# Patient Record
Sex: Male | Born: 1995 | Race: Black or African American | Hispanic: No | Marital: Single | Smoking: Never smoker
Health system: Southern US, Community
[De-identification: ages and names within clinical notes are randomized; demographics above are authoritative.]

## PROBLEM LIST (undated history)

## (undated) HISTORY — PX: SHOULDER ARTHROSCOPY: SHX128

---
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
## 2014-02-04 ENCOUNTER — Other Ambulatory Visit: Payer: Self-pay | Admitting: Family Medicine

## 2014-02-04 DIAGNOSIS — M25512 Pain in left shoulder: Secondary | ICD-10-CM

## 2014-02-18 ENCOUNTER — Ambulatory Visit
Admission: RE | Admit: 2014-02-18 | Discharge: 2014-02-18 | Disposition: A | Discharge status: Home / Self Care | Payer: Medicaid Other | Source: Ambulatory Visit | Attending: Family Medicine | Admitting: Family Medicine

## 2014-02-18 ENCOUNTER — Ambulatory Visit
Admission: RE | Admit: 2014-02-18 | Discharge: 2014-02-18 | Disposition: A | Discharge status: Home / Self Care | Payer: BC Managed Care – PPO | Source: Ambulatory Visit | Attending: Family Medicine | Admitting: Family Medicine

## 2014-02-18 DIAGNOSIS — M25512 Pain in left shoulder: Secondary | ICD-10-CM

## 2014-02-18 MED ORDER — IOHEXOL 180 MG/ML  SOLN
15.0000 mL | Freq: Once | INTRAMUSCULAR | Status: AC | PRN
  Administered 2014-02-18: 15 mL via INTRA_ARTICULAR

## 2017-06-12 ENCOUNTER — Ambulatory Visit (HOSPITAL_COMMUNITY)
Admission: EM | Admit: 2017-06-12 | Discharge: 2017-06-12 | Disposition: A | Discharge status: Home / Self Care | Payer: Managed Care, Other (non HMO) | Attending: Urgent Care | Admitting: Urgent Care

## 2017-06-12 ENCOUNTER — Encounter (HOSPITAL_COMMUNITY): Payer: Self-pay | Admitting: Emergency Medicine

## 2017-06-12 ENCOUNTER — Other Ambulatory Visit: Payer: Self-pay

## 2017-06-12 DIAGNOSIS — Z202 Contact with and (suspected) exposure to infections with a predominantly sexual mode of transmission: Secondary | ICD-10-CM | POA: Diagnosis not present

## 2017-06-12 DIAGNOSIS — Z7251 High risk heterosexual behavior: Secondary | ICD-10-CM | POA: Diagnosis not present

## 2017-06-12 DIAGNOSIS — R369 Urethral discharge, unspecified: Secondary | ICD-10-CM

## 2017-06-12 DIAGNOSIS — R300 Dysuria: Secondary | ICD-10-CM

## 2017-06-12 MED ORDER — AZITHROMYCIN 250 MG PO TABS
ORAL_TABLET | ORAL | Status: AC
  Filled 2017-06-12: qty 4

## 2017-06-12 MED ORDER — AZITHROMYCIN 250 MG PO TABS
1000.0000 mg | ORAL_TABLET | Freq: Once | ORAL | Status: AC
  Administered 2017-06-12: 1000 mg via ORAL

## 2017-06-12 MED ORDER — LIDOCAINE HCL (PF) 2 % IJ SOLN
INTRAMUSCULAR | Status: AC
Start: 2017-06-12 — End: 2017-06-12
  Filled 2017-06-12: qty 2

## 2017-06-12 MED ORDER — LIDOCAINE HCL (PF) 1 % IJ SOLN
INTRAMUSCULAR | Status: AC
  Filled 2017-06-12: qty 2

## 2017-06-12 MED ORDER — CEFTRIAXONE SODIUM 250 MG IJ SOLR
INTRAMUSCULAR | Status: AC
  Filled 2017-06-12: qty 250

## 2017-06-12 MED ORDER — CEFTRIAXONE SODIUM 250 MG IJ SOLR
250.0000 mg | Freq: Once | INTRAMUSCULAR | Status: AC
  Administered 2017-06-12: 250 mg via INTRAMUSCULAR

## 2017-06-12 NOTE — ED Triage Notes (Signed)
Seen by mani, pa prior to nursing staff 

## 2017-06-12 NOTE — ED Provider Notes (Signed)
  MRN: [id number] DOB: 1995-09-16  Subjective:   Johnathan Herrera is a 22 y.o. male presenting for 1 week history of penile discharge, mild dysuria. Patient has unprotected sex. Went and got tested at [facility] and results were negative. Denies fever, hematuria, urinary frequency, penile discharge, penile swelling, testicular pain, testicular swelling, anal pain, groin pain. Denies smoking cigarettes or drinking alcohol. He is not taking any medications, has No Known Allergies. Also denies past medical and surgical history.   Objective:   Vitals: BP 129/70 (BP Location: Right Arm)   Pulse 72   Temp 98.2 F (36.8 C) (Oral)   Resp 16   SpO2 100%   Physical Exam  Constitutional: He is oriented to person, place, and time. He appears well-developed and well-nourished.  Cardiovascular: Normal rate.  Pulmonary/Chest: Effort normal.  Neurological: He is alert and oriented to person, place, and time.   Assessment and Plan :   Unprotected sex  Penile discharge  Empiric treatment for STI as recommended by CDC. Labs pending. Return-to-clinic precautions discussed, patient verbalized understanding.    Johnathan Herrera, Johnathan Goree, PA-C 06/12/17 2121

## 2017-06-13 LAB — URINE CYTOLOGY ANCILLARY ONLY
Chlamydia: NEGATIVE
Neisseria Gonorrhea: NEGATIVE
TRICH (WINDOWPATH): NEGATIVE

## 2017-06-15 ENCOUNTER — Telehealth (HOSPITAL_COMMUNITY): Payer: Self-pay

## 2017-06-15 NOTE — Telephone Encounter (Signed)
Pt contacted and aware of negative results from recent visit

## 2019-08-13 DEATH — deceased

## 2020-11-07 ENCOUNTER — Encounter (HOSPITAL_COMMUNITY)
Admission: EM | Disposition: A | Discharge status: Home / Self Care | Payer: Self-pay | Source: Home / Self Care | Attending: Emergency Medicine

## 2020-11-07 ENCOUNTER — Emergency Department (HOSPITAL_COMMUNITY): Payer: Managed Care, Other (non HMO) | Admitting: Anesthesiology

## 2020-11-07 ENCOUNTER — Emergency Department (HOSPITAL_COMMUNITY): Payer: Managed Care, Other (non HMO)

## 2020-11-07 ENCOUNTER — Encounter (HOSPITAL_BASED_OUTPATIENT_CLINIC_OR_DEPARTMENT_OTHER): Payer: Self-pay

## 2020-11-07 ENCOUNTER — Observation Stay (HOSPITAL_BASED_OUTPATIENT_CLINIC_OR_DEPARTMENT_OTHER)
Admission: EM | Admit: 2020-11-07 | Discharge: 2020-11-08 | Disposition: A | Discharge status: Home / Self Care | Payer: Managed Care, Other (non HMO) | Attending: Orthopedic Surgery | Admitting: Orthopedic Surgery

## 2020-11-07 ENCOUNTER — Emergency Department (HOSPITAL_BASED_OUTPATIENT_CLINIC_OR_DEPARTMENT_OTHER): Payer: Managed Care, Other (non HMO)

## 2020-11-07 DIAGNOSIS — Z23 Encounter for immunization: Secondary | ICD-10-CM | POA: Diagnosis not present

## 2020-11-07 DIAGNOSIS — S93115A Dislocation of interphalangeal joint of left lesser toe(s), initial encounter: Secondary | ICD-10-CM | POA: Insufficient documentation

## 2020-11-07 DIAGNOSIS — S99922A Unspecified injury of left foot, initial encounter: Secondary | ICD-10-CM | POA: Diagnosis present

## 2020-11-07 DIAGNOSIS — S96122A Laceration of muscle and tendon of long extensor muscle of toe at ankle and foot level, left foot, initial encounter: Secondary | ICD-10-CM | POA: Diagnosis not present

## 2020-11-07 DIAGNOSIS — S93119A Dislocation of interphalangeal joint of unspecified toe(s), initial encounter: Secondary | ICD-10-CM

## 2020-11-07 DIAGNOSIS — Z20822 Contact with and (suspected) exposure to covid-19: Secondary | ICD-10-CM | POA: Diagnosis not present

## 2020-11-07 DIAGNOSIS — S92415B Nondisplaced fracture of proximal phalanx of left great toe, initial encounter for open fracture: Secondary | ICD-10-CM | POA: Diagnosis not present

## 2020-11-07 DIAGNOSIS — S92912B Unspecified fracture of left toe(s), initial encounter for open fracture: Secondary | ICD-10-CM

## 2020-11-07 DIAGNOSIS — S91109A Unspecified open wound of unspecified toe(s) without damage to nail, initial encounter: Secondary | ICD-10-CM

## 2020-11-07 HISTORY — PX: I & D EXTREMITY: SHX5045

## 2020-11-07 HISTORY — PX: PERCUTANEOUS PINNING: SHX2209

## 2020-11-07 LAB — CBC WITH DIFFERENTIAL/PLATELET
Abs Immature Granulocytes: 0.02 10*3/uL (ref 0.00–0.07)
Basophils Absolute: 0 10*3/uL (ref 0.0–0.1)
Basophils Relative: 0 %
Eosinophils Absolute: 0.1 10*3/uL (ref 0.0–0.5)
Eosinophils Relative: 2 %
HCT: 47.7 % (ref 39.0–52.0)
Hemoglobin: 16.1 g/dL (ref 13.0–17.0)
Immature Granulocytes: 0 %
Lymphocytes Relative: 56 %
Lymphs Abs: 4.1 10*3/uL — ABNORMAL HIGH (ref 0.7–4.0)
MCH: 27.1 pg (ref 26.0–34.0)
MCHC: 33.8 g/dL (ref 30.0–36.0)
MCV: 80.3 fL (ref 80.0–100.0)
Monocytes Absolute: 0.6 10*3/uL (ref 0.1–1.0)
Monocytes Relative: 8 %
Neutro Abs: 2.5 10*3/uL (ref 1.7–7.7)
Neutrophils Relative %: 34 %
Platelets: 333 10*3/uL (ref 150–400)
RBC: 5.94 MIL/uL — ABNORMAL HIGH (ref 4.22–5.81)
RDW: 12.9 % (ref 11.5–15.5)
WBC: 7.4 10*3/uL (ref 4.0–10.5)
nRBC: 0 % (ref 0.0–0.2)

## 2020-11-07 LAB — COMPREHENSIVE METABOLIC PANEL
ALT: 23 U/L (ref 0–44)
AST: 25 U/L (ref 15–41)
Albumin: 4.4 g/dL (ref 3.5–5.0)
Alkaline Phosphatase: 58 U/L (ref 38–126)
Anion gap: 9 (ref 5–15)
BUN: 15 mg/dL (ref 6–20)
CO2: 27 mmol/L (ref 22–32)
Calcium: 9.3 mg/dL (ref 8.9–10.3)
Chloride: 102 mmol/L (ref 98–111)
Creatinine, Ser: 1.21 mg/dL (ref 0.61–1.24)
GFR, Estimated: >60 mL/min (ref >60)
Glucose, Bld: 148 mg/dL — ABNORMAL HIGH (ref 70–99)
Potassium: 3.2 mmol/L — ABNORMAL LOW (ref 3.5–5.1)
Sodium: 138 mmol/L (ref 135–145)
Total Bilirubin: 0.6 mg/dL (ref 0.3–1.2)
Total Protein: 8.2 g/dL — ABNORMAL HIGH (ref 6.5–8.1)

## 2020-11-07 LAB — RESP PANEL BY RT-PCR (FLU A&B, COVID) ARPGX2
Influenza A by PCR: NEGATIVE
Influenza B by PCR: NEGATIVE
SARS Coronavirus 2 by RT PCR: NEGATIVE

## 2020-11-07 SURGERY — IRRIGATION AND DEBRIDEMENT EXTREMITY
Anesthesia: General | Laterality: Left

## 2020-11-07 MED ORDER — PHENYLEPHRINE 40 MCG/ML (10ML) SYRINGE FOR IV PUSH (FOR BLOOD PRESSURE SUPPORT)
PREFILLED_SYRINGE | INTRAVENOUS | Status: AC
  Filled 2020-11-07: qty 10

## 2020-11-07 MED ORDER — METHOCARBAMOL 1000 MG/10ML IJ SOLN
500.0000 mg | Freq: Four times a day (QID) | INTRAVENOUS | Status: DC | PRN
  Filled 2020-11-07: qty 5

## 2020-11-07 MED ORDER — ORAL CARE MOUTH RINSE: 15.0000 mL | Freq: Once | OROMUCOSAL | Status: DC

## 2020-11-07 MED ORDER — LIDOCAINE 2% (20 MG/ML) 5 ML SYRINGE
INTRAMUSCULAR | Status: DC | PRN
  Administered 2020-11-07: 80 mg via INTRAVENOUS

## 2020-11-07 MED ORDER — CEFAZOLIN SODIUM 1 G IJ SOLR
INTRAMUSCULAR | Status: AC
  Filled 2020-11-07: qty 20

## 2020-11-07 MED ORDER — SODIUM CHLORIDE 0.9% FLUSH
3.0000 mL | Freq: Two times a day (BID) | INTRAVENOUS | Status: DC
  Administered 2020-11-08 (×2): 3 mL via INTRAVENOUS

## 2020-11-07 MED ORDER — SUGAMMADEX SODIUM 200 MG/2ML IV SOLN
INTRAVENOUS | Status: DC | PRN
  Administered 2020-11-07: 200 mg via INTRAVENOUS

## 2020-11-07 MED ORDER — LACTATED RINGERS IV SOLN: INTRAVENOUS | Status: DC

## 2020-11-07 MED ORDER — CHLORHEXIDINE GLUCONATE 0.12 % MT SOLN: 15.0000 mL | Freq: Once | OROMUCOSAL | Status: DC

## 2020-11-07 MED ORDER — MIDAZOLAM HCL 2 MG/2ML IJ SOLN
INTRAMUSCULAR | Status: AC
  Filled 2020-11-07: qty 2

## 2020-11-07 MED ORDER — FENTANYL CITRATE (PF) 100 MCG/2ML IJ SOLN: 25.0000 ug | INTRAMUSCULAR | Status: DC | PRN

## 2020-11-07 MED ORDER — HYDROMORPHONE HCL 1 MG/ML IJ SOLN
2.0000 mg | Freq: Once | INTRAMUSCULAR | Status: AC
  Administered 2020-11-07: 2 mg via INTRAVENOUS
  Filled 2020-11-07: qty 2

## 2020-11-07 MED ORDER — BISACODYL 5 MG PO TBEC: 5.0000 mg | DELAYED_RELEASE_TABLET | Freq: Every day | ORAL | Status: DC | PRN

## 2020-11-07 MED ORDER — SODIUM CHLORIDE 0.9% FLUSH
3.0000 mL | INTRAVENOUS | Status: DC | PRN
  Administered 2020-11-08: 3 mL via INTRAVENOUS

## 2020-11-07 MED ORDER — CEFAZOLIN SODIUM-DEXTROSE 2-3 GM-%(50ML) IV SOLR
INTRAVENOUS | Status: DC | PRN
  Administered 2020-11-07: 2 g via INTRAVENOUS

## 2020-11-07 MED ORDER — MIDAZOLAM HCL 2 MG/2ML IJ SOLN
INTRAMUSCULAR | Status: DC | PRN
  Administered 2020-11-07: 2 mg via INTRAVENOUS

## 2020-11-07 MED ORDER — 0.9 % SODIUM CHLORIDE (POUR BTL) OPTIME
TOPICAL | Status: DC | PRN
  Administered 2020-11-07: 1000 mL

## 2020-11-07 MED ORDER — LIDOCAINE 2% (20 MG/ML) 5 ML SYRINGE
INTRAMUSCULAR | Status: AC
  Filled 2020-11-07: qty 10

## 2020-11-07 MED ORDER — ROCURONIUM BROMIDE 10 MG/ML (PF) SYRINGE
PREFILLED_SYRINGE | INTRAVENOUS | Status: DC | PRN
  Administered 2020-11-07: 50 mg via INTRAVENOUS

## 2020-11-07 MED ORDER — ONDANSETRON HCL 4 MG/2ML IJ SOLN
INTRAMUSCULAR | Status: AC
  Filled 2020-11-07: qty 4

## 2020-11-07 MED ORDER — ROCURONIUM BROMIDE 10 MG/ML (PF) SYRINGE
PREFILLED_SYRINGE | INTRAVENOUS | Status: AC
  Filled 2020-11-07: qty 20

## 2020-11-07 MED ORDER — ACETAMINOPHEN 10 MG/ML IV SOLN
INTRAVENOUS | Status: AC
  Filled 2020-11-07: qty 100

## 2020-11-07 MED ORDER — ONDANSETRON HCL 4 MG/2ML IJ SOLN
4.0000 mg | Freq: Once | INTRAMUSCULAR | Status: AC
  Administered 2020-11-07: 4 mg via INTRAVENOUS
  Filled 2020-11-07: qty 2

## 2020-11-07 MED ORDER — PROPOFOL 10 MG/ML IV BOLUS
INTRAVENOUS | Status: AC
  Filled 2020-11-07: qty 20

## 2020-11-07 MED ORDER — DOCUSATE SODIUM 100 MG PO CAPS
100.0000 mg | ORAL_CAPSULE | Freq: Two times a day (BID) | ORAL | Status: DC
  Administered 2020-11-08 (×2): 100 mg via ORAL
  Filled 2020-11-07 (×2): qty 1

## 2020-11-07 MED ORDER — CEFAZOLIN SODIUM-DEXTROSE 2-4 GM/100ML-% IV SOLN
2.0000 g | Freq: Three times a day (TID) | INTRAVENOUS | Status: AC
  Administered 2020-11-08 (×3): 2 g via INTRAVENOUS
  Filled 2020-11-07 (×3): qty 100

## 2020-11-07 MED ORDER — LACTATED RINGERS IV SOLN: INTRAVENOUS | Status: DC | PRN

## 2020-11-07 MED ORDER — ONDANSETRON HCL 4 MG/2ML IJ SOLN: 4.0000 mg | Freq: Four times a day (QID) | INTRAMUSCULAR | Status: DC | PRN

## 2020-11-07 MED ORDER — FENTANYL CITRATE (PF) 250 MCG/5ML IJ SOLN
INTRAMUSCULAR | Status: DC | PRN
  Administered 2020-11-07: 100 ug via INTRAVENOUS
  Administered 2020-11-07 (×3): 50 ug via INTRAVENOUS

## 2020-11-07 MED ORDER — ACETAMINOPHEN 10 MG/ML IV SOLN
INTRAVENOUS | Status: DC | PRN
  Administered 2020-11-07: 1000 mg via INTRAVENOUS

## 2020-11-07 MED ORDER — POTASSIUM CHLORIDE IN NACL 20-0.9 MEQ/L-% IV SOLN
INTRAVENOUS | Status: DC
  Filled 2020-11-07: qty 1000

## 2020-11-07 MED ORDER — OXYCODONE-ACETAMINOPHEN 5-325 MG PO TABS
1.0000 | ORAL_TABLET | ORAL | Status: DC | PRN
  Administered 2020-11-08: 1 via ORAL
  Filled 2020-11-07: qty 1

## 2020-11-07 MED ORDER — AMISULPRIDE (ANTIEMETIC) 5 MG/2ML IV SOLN: 10.0000 mg | Freq: Once | INTRAVENOUS | Status: DC | PRN

## 2020-11-07 MED ORDER — ONDANSETRON HCL 4 MG/2ML IJ SOLN
INTRAMUSCULAR | Status: DC | PRN
  Administered 2020-11-07: 4 mg via INTRAVENOUS

## 2020-11-07 MED ORDER — TETANUS-DIPHTH-ACELL PERTUSSIS 5-2.5-18.5 LF-MCG/0.5 IM SUSY
0.5000 mL | PREFILLED_SYRINGE | Freq: Once | INTRAMUSCULAR | Status: AC
  Administered 2020-11-07: 0.5 mL via INTRAMUSCULAR
  Filled 2020-11-07: qty 0.5

## 2020-11-07 MED ORDER — PHENOL 1.4 % MT LIQD: 1.0000 | OROMUCOSAL | Status: DC | PRN

## 2020-11-07 MED ORDER — CEFAZOLIN SODIUM-DEXTROSE 2-4 GM/100ML-% IV SOLN
2.0000 g | Freq: Once | INTRAVENOUS | Status: AC
  Administered 2020-11-07: 2 g via INTRAVENOUS
  Filled 2020-11-07: qty 100

## 2020-11-07 MED ORDER — DEXMEDETOMIDINE (PRECEDEX) IN NS 20 MCG/5ML (4 MCG/ML) IV SYRINGE
PREFILLED_SYRINGE | INTRAVENOUS | Status: DC | PRN
  Administered 2020-11-07: 8 ug via INTRAVENOUS

## 2020-11-07 MED ORDER — DEXAMETHASONE SODIUM PHOSPHATE 10 MG/ML IJ SOLN
INTRAMUSCULAR | Status: DC | PRN
  Administered 2020-11-07: 4 mg via INTRAVENOUS

## 2020-11-07 MED ORDER — METHOCARBAMOL 500 MG PO TABS: 500.0000 mg | ORAL_TABLET | Freq: Four times a day (QID) | ORAL | Status: DC | PRN

## 2020-11-07 MED ORDER — FLEET ENEMA 7-19 GM/118ML RE ENEM
1.0000 | ENEMA | Freq: Once | RECTAL | Status: DC | PRN
Start: 2020-11-07 — End: 2020-11-09

## 2020-11-07 MED ORDER — CEFAZOLIN SODIUM-DEXTROSE 1-4 GM/50ML-% IV SOLN
1.0000 g | Freq: Once | INTRAVENOUS | Status: DC
  Filled 2020-11-07: qty 50

## 2020-11-07 MED ORDER — MORPHINE SULFATE (PF) 4 MG/ML IV SOLN
4.0000 mg | Freq: Once | INTRAVENOUS | Status: AC
  Administered 2020-11-07: 4 mg via INTRAVENOUS
  Filled 2020-11-07: qty 1

## 2020-11-07 MED ORDER — SODIUM CHLORIDE 0.9 % IV SOLN
250.0000 mL | INTRAVENOUS | Status: DC
  Administered 2020-11-08: 250 mL via INTRAVENOUS

## 2020-11-07 MED ORDER — ONDANSETRON HCL 4 MG PO TABS: 4.0000 mg | ORAL_TABLET | Freq: Four times a day (QID) | ORAL | Status: DC | PRN

## 2020-11-07 MED ORDER — ASPIRIN 325 MG PO TABS
325.0000 mg | ORAL_TABLET | Freq: Two times a day (BID) | ORAL | Status: DC
  Administered 2020-11-08 (×2): 325 mg via ORAL
  Filled 2020-11-07 (×2): qty 1

## 2020-11-07 MED ORDER — FENTANYL CITRATE (PF) 100 MCG/2ML IJ SOLN: 50.0000 ug | Freq: Once | INTRAMUSCULAR | Status: DC

## 2020-11-07 MED ORDER — SODIUM CHLORIDE 0.9 % IR SOLN
Status: DC | PRN
  Administered 2020-11-07: 3000 mL

## 2020-11-07 MED ORDER — PROPOFOL 10 MG/ML IV BOLUS
INTRAVENOUS | Status: DC | PRN
  Administered 2020-11-07: 200 mg via INTRAVENOUS

## 2020-11-07 MED ORDER — SENNOSIDES-DOCUSATE SODIUM 8.6-50 MG PO TABS: 1.0000 | ORAL_TABLET | Freq: Every evening | ORAL | Status: DC | PRN

## 2020-11-07 MED ORDER — MENTHOL 3 MG MT LOZG: 1.0000 | LOZENGE | OROMUCOSAL | Status: DC | PRN

## 2020-11-07 MED ORDER — FENTANYL CITRATE (PF) 100 MCG/2ML IJ SOLN
100.0000 ug | Freq: Once | INTRAMUSCULAR | Status: AC
  Administered 2020-11-07: 100 ug via INTRAVENOUS

## 2020-11-07 MED ORDER — ACETAMINOPHEN 650 MG RE SUPP: 650.0000 mg | RECTAL | Status: DC | PRN

## 2020-11-07 MED ORDER — FENTANYL CITRATE (PF) 100 MCG/2ML IJ SOLN
INTRAMUSCULAR | Status: AC
  Filled 2020-11-07: qty 2

## 2020-11-07 MED ORDER — PANTOPRAZOLE SODIUM 40 MG IV SOLR
40.0000 mg | Freq: Every day | INTRAVENOUS | Status: DC
  Administered 2020-11-08: 40 mg via INTRAVENOUS
  Filled 2020-11-07: qty 40

## 2020-11-07 MED ORDER — HYDROCODONE-ACETAMINOPHEN 5-325 MG PO TABS
1.0000 | ORAL_TABLET | ORAL | Status: DC | PRN
  Administered 2020-11-08 (×2): 1 via ORAL
  Filled 2020-11-07 (×2): qty 1

## 2020-11-07 MED ORDER — CHLORHEXIDINE GLUCONATE 0.12 % MT SOLN
OROMUCOSAL | Status: AC
  Filled 2020-11-07: qty 15

## 2020-11-07 MED ORDER — DEXAMETHASONE SODIUM PHOSPHATE 10 MG/ML IJ SOLN
INTRAMUSCULAR | Status: AC
  Filled 2020-11-07: qty 2

## 2020-11-07 MED ORDER — ACETAMINOPHEN 325 MG PO TABS: 650.0000 mg | ORAL_TABLET | ORAL | Status: DC | PRN

## 2020-11-07 MED ORDER — MORPHINE SULFATE (PF) 2 MG/ML IV SOLN: 1.0000 mg | INTRAVENOUS | Status: DC | PRN

## 2020-11-07 MED ORDER — HYDROMORPHONE HCL 1 MG/ML IJ SOLN
1.0000 mg | Freq: Once | INTRAMUSCULAR | Status: AC
  Administered 2020-11-07: 1 mg via INTRAVENOUS
  Filled 2020-11-07: qty 1

## 2020-11-07 MED ORDER — ALUM & MAG HYDROXIDE-SIMETH 200-200-20 MG/5ML PO SUSP: 30.0000 mL | Freq: Four times a day (QID) | ORAL | Status: DC | PRN

## 2020-11-07 MED ORDER — ZOLPIDEM TARTRATE 5 MG PO TABS: 5.0000 mg | ORAL_TABLET | Freq: Every evening | ORAL | Status: DC | PRN

## 2020-11-07 MED ORDER — FENTANYL CITRATE (PF) 250 MCG/5ML IJ SOLN
INTRAMUSCULAR | Status: AC
  Filled 2020-11-07: qty 5

## 2020-11-07 SURGICAL SUPPLY — 66 items
BAG COUNTER SPONGE SURGICOUNT (BAG) ×2 IMPLANT
BLADE SURG 10 STRL SS (BLADE) ×2 IMPLANT
BNDG COHESIVE 4X5 TAN STRL (GAUZE/BANDAGES/DRESSINGS) ×2 IMPLANT
BNDG ELASTIC 3X5.8 VLCR STR LF (GAUZE/BANDAGES/DRESSINGS) ×2 IMPLANT
BNDG ELASTIC 4X5.8 VLCR STR LF (GAUZE/BANDAGES/DRESSINGS) ×2 IMPLANT
BNDG ELASTIC 6X5.8 VLCR STR LF (GAUZE/BANDAGES/DRESSINGS) ×2 IMPLANT
BNDG GAUZE ELAST 4 BULKY (GAUZE/BANDAGES/DRESSINGS) ×2 IMPLANT
BOOT POST OP MALE XLG (SOFTGOODS) ×2 IMPLANT
BRUSH SCRUB EZ PLAIN DRY (MISCELLANEOUS) ×2 IMPLANT
CANISTER SUCT 3000ML PPV (MISCELLANEOUS) ×2 IMPLANT
CAP PIN PROTECTOR ORTHO WHT (CAP) ×2 IMPLANT
CHLORAPREP W/TINT 26 (MISCELLANEOUS) ×2 IMPLANT
COVER SURGICAL LIGHT HANDLE (MISCELLANEOUS) ×2 IMPLANT
CUFF TOURN SGL QUICK 34 (TOURNIQUET CUFF)
CUFF TOURN SGL QUICK 42 (TOURNIQUET CUFF) IMPLANT
CUFF TRNQT CYL 34X4.125X (TOURNIQUET CUFF) IMPLANT
DRAIN PENROSE 1/2X12 LTX STRL (WOUND CARE) IMPLANT
DRAPE U-SHAPE 47X51 STRL (DRAPES) ×2 IMPLANT
DRSG ADAPTIC 3X8 NADH LF (GAUZE/BANDAGES/DRESSINGS) ×2 IMPLANT
DRSG PAD ABDOMINAL 8X10 ST (GAUZE/BANDAGES/DRESSINGS) IMPLANT
DURAPREP 26ML APPLICATOR (WOUND CARE) IMPLANT
ELECT REM PT RETURN 9FT ADLT (ELECTROSURGICAL) ×2
ELECTRODE REM PT RTRN 9FT ADLT (ELECTROSURGICAL) ×1 IMPLANT
FACESHIELD WRAPAROUND (MASK) ×4 IMPLANT
GAUZE 4X4 16PLY ~~LOC~~+RFID DBL (SPONGE) ×2 IMPLANT
GAUZE SPONGE 4X4 12PLY STRL (GAUZE/BANDAGES/DRESSINGS) ×2 IMPLANT
GAUZE XEROFORM 5X9 LF (GAUZE/BANDAGES/DRESSINGS) ×2 IMPLANT
GLOVE SRG 8 PF TXTR STRL LF DI (GLOVE) ×1 IMPLANT
GLOVE SURG ENC MOIS LTX SZ6.5 (GLOVE) ×6 IMPLANT
GLOVE SURG ENC MOIS LTX SZ7.5 (GLOVE) ×6 IMPLANT
GLOVE SURG ENC MOIS LTX SZ8 (GLOVE) ×2 IMPLANT
GLOVE SURG UNDER POLY LF SZ6.5 (GLOVE) ×2 IMPLANT
GLOVE SURG UNDER POLY LF SZ7.5 (GLOVE) ×2 IMPLANT
GLOVE SURG UNDER POLY LF SZ8 (GLOVE) ×1
GOWN STRL REUS W/ TWL LRG LVL3 (GOWN DISPOSABLE) ×3 IMPLANT
GOWN STRL REUS W/TWL 2XL LVL3 (GOWN DISPOSABLE) ×2 IMPLANT
GOWN STRL REUS W/TWL LRG LVL3 (GOWN DISPOSABLE) ×3
HANDPIECE INTERPULSE COAX TIP (DISPOSABLE) ×1
K-WIRE .045X4 (WIRE) ×4 IMPLANT
K-WIRE DBL TROCAR .045X4 (WIRE) ×4
K-Wire 0.045" ×4 IMPLANT
KIT BASIN OR (CUSTOM PROCEDURE TRAY) ×2 IMPLANT
KIT TURNOVER KIT B (KITS) ×2 IMPLANT
KWIRE DBL TROCAR .045X4 (WIRE) ×2 IMPLANT
MANIFOLD NEPTUNE II (INSTRUMENTS) ×2 IMPLANT
NS IRRIG 1000ML POUR BTL (IV SOLUTION) ×2 IMPLANT
PACK ORTHO EXTREMITY (CUSTOM PROCEDURE TRAY) ×2 IMPLANT
PAD ARMBOARD 7.5X6 YLW CONV (MISCELLANEOUS) ×4 IMPLANT
PADDING CAST COTTON 6X4 STRL (CAST SUPPLIES) ×2 IMPLANT
PENCIL BUTTON HOLSTER BLD 10FT (ELECTRODE) IMPLANT
SET HNDPC FAN SPRY TIP SCT (DISPOSABLE) ×1 IMPLANT
SPONGE T-LAP 18X18 ~~LOC~~+RFID (SPONGE) ×2 IMPLANT
STOCKINETTE IMPERVIOUS 9X36 MD (GAUZE/BANDAGES/DRESSINGS) ×2 IMPLANT
SUT ETHILON 2 0 FS 18 (SUTURE) ×10 IMPLANT
SUT ETHILON 3 0 PS 1 (SUTURE) IMPLANT
SUT MNCRL AB 3-0 PS2 18 (SUTURE) IMPLANT
SUT PROLENE 0 CT (SUTURE) IMPLANT
SUT VIC AB 2-0 SH 27 (SUTURE) ×1
SUT VIC AB 2-0 SH 27XBRD (SUTURE) ×1 IMPLANT
SWAB CULTURE ESWAB REG 1ML (MISCELLANEOUS) ×2 IMPLANT
TOWEL GREEN STERILE (TOWEL DISPOSABLE) ×2 IMPLANT
TOWEL GREEN STERILE FF (TOWEL DISPOSABLE) ×2 IMPLANT
TUBE CONNECTING 12X1/4 (SUCTIONS) ×2 IMPLANT
UNDERPAD 30X36 HEAVY ABSORB (UNDERPADS AND DIAPERS) ×2 IMPLANT
WATER STERILE IRR 1000ML POUR (IV SOLUTION) ×2 IMPLANT
YANKAUER SUCT BULB TIP NO VENT (SUCTIONS) ×2 IMPLANT

## 2020-11-07 NOTE — ED Notes (Signed)
Attempted to give report to CN at [facility] ED, no answer at [phone]

## 2020-11-07 NOTE — Progress Notes (Signed)
Orthopedic Tech Progress Note Patient Details:  Johnathan Herrera Jun 03, 1995 [id number]  Ortho Devices Type of Ortho Device: Postop shoe/boot Ortho Device/Splint Location: delivered to or. Ortho Device/Splint Interventions: Application, Adjustment, Ordered   Post Interventions Patient Tolerated: Well Instructions Provided: Care of device, Adjustment of device  Trinna Post 11/07/2020, 8:18 PM

## 2020-11-07 NOTE — ED Notes (Signed)
Called CN phone at [facility] ED x 2, no answer, to attempt to give report

## 2020-11-07 NOTE — Anesthesia Procedure Notes (Signed)
Procedure Name: Intubation Date/Time: 11/07/2020 7:22 PM Performed by: Sammie Bench, CRNA Pre-anesthesia Checklist: Patient identified, Emergency Drugs available, Suction available and Patient being monitored Patient Re-evaluated:Patient Re-evaluated prior to induction Oxygen Delivery Method: Circle System Utilized Preoxygenation: Pre-oxygenation with 100% oxygen Induction Type: IV induction Ventilation: Mask ventilation without difficulty and Oral airway inserted - appropriate to patient size Laryngoscope Size: Mac and 4 Grade View: Grade II Tube type: Oral Tube size: 8.0 mm Number of attempts: 1 Airway Equipment and Method: Stylet and Oral airway Placement Confirmation: ETT inserted through vocal cords under direct vision, positive ETCO2 and breath sounds checked- equal and bilateral Secured at: 24 cm Tube secured with: Tape Dental Injury: Teeth and Oropharynx as per pre-operative assessment
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
## 2020-11-07 NOTE — Anesthesia Preprocedure Evaluation (Signed)
Anesthesia Evaluation  Patient identified by MRN, date of birth, ID band Patient awake    Reviewed: Allergy & Precautions, NPO status , Patient's Chart, lab work & pertinent test results  Airway Mallampati: II  TM Distance: >3 FB Neck ROM: Full    Dental  (+) Dental Advisory Given   Pulmonary neg pulmonary ROS,    breath sounds clear to auscultation       Cardiovascular negative cardio ROS   Rhythm:Regular Rate:Normal     Neuro/Psych negative neurological ROS     GI/Hepatic negative GI ROS, Neg liver ROS,   Endo/Other  negative endocrine ROS  Renal/GU negative Renal ROS     Musculoskeletal   Abdominal   Peds  Hematology negative hematology ROS (+)   Anesthesia Other Findings   Reproductive/Obstetrics                             Anesthesia Physical Anesthesia Plan  ASA: 1 and emergent  Anesthesia Plan: General   Post-op Pain Management:    Induction: Intravenous and Rapid sequence  PONV Risk Score and Plan: 2 and Dexamethasone, Ondansetron and Treatment may vary due to age or medical condition  Airway Management Planned: Oral ETT  Additional Equipment: None  Intra-op Plan:   Post-operative Plan: Extubation in OR  Informed Consent: I have reviewed the patients History and Physical, chart, labs and discussed the procedure including the risks, benefits and alternatives for the proposed anesthesia with the patient or authorized representative who has indicated his/her understanding and acceptance.     Dental advisory given  Plan Discussed with: CRNA  Anesthesia Plan Comments:         Anesthesia Quick Evaluation

## 2020-11-07 NOTE — ED Notes (Signed)
Report given to Calvin RN with [facility] °

## 2020-11-07 NOTE — ED Triage Notes (Addendum)
Open fracture to left 2nd toe, last tdap unkown.
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
## 2020-11-07 NOTE — ED Notes (Signed)
Arrived at [facility] Room 39.
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
## 2020-11-07 NOTE — Progress Notes (Signed)
    Patient PO L Great and  2nd toe pinning's with I&D and multiple deep lacerations repaired. Pt placed in PO shoe, NWB LLE with pins capped.   Please elevate L LE PO NWB LLE Crutches ordered  ASA BID for DVT prophylaxis  Robaxin for spasm Norco for MOD px, Percocet for SEV px  Ancef 2g Q8hrs X3 doses PO  D/C expected after completion of ABX tomorrow night F/U with Dr. [facility] 1 week PO   Estill Bamberg, MD Jason Coop, PA-C

## 2020-11-07 NOTE — ED Notes (Signed)
To OR via stretcher

## 2020-11-07 NOTE — Transfer of Care (Signed)
Immediate Anesthesia Transfer of Care Note  Patient: Johnathan Herrera  Procedure(s) Performed: IRRIGATION AND DEBRIDEMENT OF OPEN LEFT FOOT FRACTURE POSSIBLE TENDON REPAIR (Left) PERCUTANEOUS PINNING LEFT FOOT (Left)  Patient Location: PACU  Anesthesia Type:General  Level of Consciousness: drowsy and responds to stimulation  Airway & Oxygen Therapy: Patient Spontanous Breathing and Patient connected to nasal cannula oxygen  Post-op Assessment: Report given to RN and Post -op Vital signs reviewed and stable  Post vital signs: Reviewed and stable  Last Vitals:  Vitals Value Taken Time  BP 144/77 11/07/20 2136  Temp    Pulse 93 11/07/20 2140  Resp 15 11/07/20 2140  SpO2 96 % 11/07/20 2140  Vitals shown include unvalidated device data.  Last Pain:  Vitals:   11/07/20 1831  TempSrc:   PainSc: 3          Complications: No notable events documented.

## 2020-11-08 ENCOUNTER — Encounter (HOSPITAL_COMMUNITY): Payer: Self-pay | Admitting: Orthopedic Surgery

## 2020-11-08 ENCOUNTER — Other Ambulatory Visit: Payer: Self-pay

## 2020-11-08 MED ORDER — METHOCARBAMOL 500 MG PO TABS: 500.0000 mg | ORAL_TABLET | Freq: Four times a day (QID) | ORAL | 0 refills | Status: AC | PRN

## 2020-11-08 MED ORDER — HYDROCODONE-ACETAMINOPHEN 5-325 MG PO TABS: 1.0000 | ORAL_TABLET | ORAL | 0 refills | Status: AC | PRN

## 2020-11-08 MED ORDER — ASPIRIN 325 MG PO TABS: 325.0000 mg | ORAL_TABLET | Freq: Two times a day (BID) | ORAL | 0 refills | Status: AC

## 2020-11-08 NOTE — Progress Notes (Signed)
AVS given and explained to patient, pt understand that he has to follow up with Dr. Susa Simmonds after several days.

## 2020-11-08 NOTE — Plan of Care (Signed)
  Problem: Activity: Goal: Ability to avoid complications of mobility impairment will improve Outcome: Progressing Goal: Ability to tolerate increased activity will improve Outcome: Progressing   Problem: Education: Goal: Verbalization of understanding the information provided will improve Outcome: Progressing   Problem: Coping: Goal: Level of anxiety will decrease Outcome: Progressing   Problem: Physical Regulation: Goal: Postoperative complications will be avoided or minimized Outcome: Progressing   Problem: Respiratory: Goal: Ability to maintain a clear airway will improve Outcome: Progressing   Problem: Pain Management: Goal: Pain level will decrease Outcome: Progressing   Problem: Skin Integrity: Goal: Signs of wound healing will improve Outcome: Progressing   Problem: Tissue Perfusion: Goal: Ability to maintain adequate tissue perfusion will improve Outcome: Progressing   

## 2020-11-08 NOTE — Progress Notes (Signed)
    Patient doing well PO day 1, minimal pain, full sensation eager to progress home   Physical Exam: Vitals:   11/08/20 0400 11/08/20 0743  BP: (!) 154/80 135/70  Pulse: 97 100  Resp:  18  Temp:  98.1 F (36.7 C)  SpO2: 95% 96%   Pt with full sensation and 2+ DPP, distal compartments soft  Resting comfortably in bed PO shoe applied, crutches at bedside Dressing in place NVI  POD #1 s/p I&D and pinning of great and 2nd toe open fracture's/dislocation  - up with OT, encourage ambulation  -NWB LLE, cleared by OT, has crutches - Norco for pain, Robaxin for muscle spasms - ASA 325 for DVT prophylaxis - likely d/c home today with f/u in 1 week with Dr Susa Simmonds  - needs to complete 3 doses 2gr ancef prior to D/C, OK to D/C tonight after
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
## 2020-11-09 ENCOUNTER — Encounter (HOSPITAL_COMMUNITY): Payer: Self-pay | Admitting: Orthopedic Surgery

## 2020-11-09 NOTE — Anesthesia Postprocedure Evaluation (Signed)
Anesthesia Post Note  Patient: Johnathan Herrera  Procedure(s) Performed: IRRIGATION AND DEBRIDEMENT OF OPEN LEFT FOOT FRACTURE POSSIBLE TENDON REPAIR (Left) PERCUTANEOUS PINNING LEFT FOOT (Left)     Patient location during evaluation: PACU Anesthesia Type: General Level of consciousness: awake and alert Pain management: pain level controlled Vital Signs Assessment: post-procedure vital signs reviewed and stable Respiratory status: spontaneous breathing, nonlabored ventilation, respiratory function stable and patient connected to nasal cannula oxygen Cardiovascular status: blood pressure returned to baseline and stable Postop Assessment: no apparent nausea or vomiting Anesthetic complications: no   No notable events documented.  Last Vitals:  Vitals:   11/08/20 0400 11/08/20 0743  BP: (!) 154/80 135/70  Pulse: 97 100  Resp:  18  Temp:  36.7 C  SpO2: 95% 96%    Last Pain:  Vitals:   11/08/20 1800  TempSrc:   PainSc: 5                  Kennieth Rad
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
